# Patient Record
Sex: Female | Born: 2005 | Race: Black or African American | Hispanic: No | Marital: Single | Smoking: Never smoker
Health system: Southern US, Community
[De-identification: ages and names within clinical notes are randomized; demographics above are authoritative.]

## PROBLEM LIST (undated history)

## (undated) ENCOUNTER — Ambulatory Visit (HOSPITAL_COMMUNITY): Discharge status: Absent without leave | Payer: Medicaid Other

## (undated) DIAGNOSIS — M2180 Other specified acquired deformities of unspecified limb: Secondary | ICD-10-CM

## (undated) DIAGNOSIS — F909 Attention-deficit hyperactivity disorder, unspecified type: Secondary | ICD-10-CM

## (undated) DIAGNOSIS — A6000 Herpesviral infection of urogenital system, unspecified: Secondary | ICD-10-CM

## (undated) DIAGNOSIS — L0100 Impetigo, unspecified: Secondary | ICD-10-CM

## (undated) HISTORY — DX: Impetigo, unspecified: L01.00

## (undated) HISTORY — DX: Other specified acquired deformities of unspecified limb: M21.80

---
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
## 2023-04-14 ENCOUNTER — Encounter (HOSPITAL_COMMUNITY): Payer: Self-pay

## 2023-04-14 ENCOUNTER — Ambulatory Visit (HOSPITAL_COMMUNITY)
Admission: EM | Admit: 2023-04-14 | Discharge: 2023-04-14 | Disposition: A | Discharge status: Home / Self Care | Payer: MEDICAID | Attending: Emergency Medicine | Admitting: Emergency Medicine

## 2023-04-14 DIAGNOSIS — N3000 Acute cystitis without hematuria: Secondary | ICD-10-CM | POA: Insufficient documentation

## 2023-04-14 DIAGNOSIS — N760 Acute vaginitis: Secondary | ICD-10-CM | POA: Insufficient documentation

## 2023-04-14 LAB — POCT URINALYSIS DIP (MANUAL ENTRY)
Bilirubin, UA: NEGATIVE
Glucose, UA: NEGATIVE mg/dL
Nitrite, UA: POSITIVE — AB
Protein Ur, POC: 100 mg/dL — AB
Spec Grav, UA: 1.02 (ref 1.010–1.025)
Urobilinogen, UA: 1 U/dL
pH, UA: 7.5 (ref 5.0–8.0)

## 2023-04-14 MED ORDER — NITROFURANTOIN MONOHYD MACRO 100 MG PO CAPS: 100.0000 mg | ORAL_CAPSULE | Freq: Two times a day (BID) | ORAL | 0 refills | Status: DC

## 2023-04-14 NOTE — Discharge Instructions (Addendum)
Your urine test came back positive for a urinary tract infection.  I am sending in antibiotics, take these 2 times daily with food until finished.  Take them even if you start feeling better.   We have tested you for vaginal infections today and we will contact you if anything results is abnormal.  Return to clinic for any new or urgent symptoms.

## 2023-04-14 NOTE — ED Triage Notes (Signed)
Pt c/o irritation on urination with urgency and frequency x2 days. Denies OTC meds.

## 2023-04-14 NOTE — ED Provider Notes (Signed)
[facility]    CSN: [id number] Arrival date & time: 04/14/23  1403      History   Chief Complaint Chief Complaint  Patient presents with   Urinary Tract Infection    HPI Samantha Hill is a 18 y.o. female.   Patient presents to clinic complaining of urinary frequency and urgency for the past few days.  She recently got off her menstrual cycle and has an IUD.  Reports during her menstrual cycles she was sexually active with a female who has other sexual partners.  Having any dysuria.  Will have some vaginal irritation and itching after urinating.  No changes to vaginal discharge.  No odor to discharge.  Some low back pain.  No abdominal pain.  No nausea or vomiting.  The history is provided by the patient and medical records.  Urinary Tract Infection   Past Medical History:  Diagnosis Date   ADHD (attention deficit hyperactivity disorder)    BULLOUS IMPETIGO 08/20/2009   Qualifier: Diagnosis of  By: Constance Goltz MD, Matthew     Chlamydia 02/11/2022   Genital herpes    Gonorrhea 02/11/2022   TIBIAL TORSION 04/25/2010   Annotation: Mild, not affecting gait. Qualifier: Diagnosis of  By: Benjamin Stain MD, Thomas     Trichomonal vaginitis 02/11/2022    Patient Active Problem List   Diagnosis Date Noted   Cesarean delivery delivered 02/14/2022   IUD (intrauterine device) in place 02/14/2022   HSV (herpes simplex virus) infection- New diagosis at 34 wks 02/02/2022   Depression 07/30/2021   Auditory hallucination 07/08/2019   ADHD (attention deficit hyperactivity disorder) 11/04/2010    Past Surgical History:  Procedure Laterality Date   CESAREAN SECTION N/A 02/14/2022   Procedure: CESAREAN SECTION;  Surgeon: Catalina Antigua, MD;  Location: [facility];  Service: Obstetrics;  Laterality: N/A;    OB History     Gravida  1   Para  1   Term  1   Preterm      AB      Living  1      SAB      IAB      Ectopic      Multiple  0   Live Births  1             Home Medications    Prior to Admission medications   Medication Sig Start Date End Date Taking? Authorizing Provider  nitrofurantoin, macrocrystal-monohydrate, (MACROBID) 100 MG capsule Take 1 capsule (100 mg total) by mouth 2 (two) times daily. 04/14/23  Yes Rinaldo Ratel, [place] N, FNP  acetaminophen (TYLENOL) 325 MG tablet Take 2 tablets (650 mg total) by mouth every 6 (six) hours as needed. Patient not taking: Reported on 04/22/2022 02/17/22   Myna Hidalgo, DO  bismuth subsalicylate (PEPTO BISMOL) 262 MG chewable tablet Chew 524 mg by mouth as needed for indigestion. Patient not taking: Reported on 04/22/2022    [provider]  cetirizine (ZYRTEC) 10 MG tablet Take 1 tablet (10 mg total) by mouth daily. Patient not taking: Reported on 04/22/2022 12/07/21   Carlisle Beers, FNP  ferrous sulfate 325 (65 FE) MG EC tablet Take 1 tablet (325 mg total) by mouth every other day. Patient not taking: Reported on 04/22/2022 02/11/22 08/10/22  Anyanwu, Jethro Bastos, MD  fluticasone (FLONASE) 50 MCG/ACT nasal spray Place 1 spray into both nostrils daily. Patient not taking: Reported on 04/22/2022 12/07/21   Carlisle Beers, FNP  ibuprofen (ADVIL) 600 MG tablet  Take 1 tablet (600 mg total) by mouth every 6 (six) hours as needed. Patient not taking: Reported on 04/22/2022 02/17/22   Myna Hidalgo, DO  Prenatal Vit-Min-FA-Fish Oil ([employer] PRENATAL GUMMY) 0.4-113.5 MG CHEW Chew 1 each by mouth daily. Patient not taking: Reported on 04/22/2022 02/10/22   Billey Co, MD  valACYclovir (VALTREX) 500 MG tablet Take two tablets by mouth twice daily for ten days; then one tablet twice daily for remainder of pregnancy Patient not taking: Reported on 04/22/2022 01/28/22   Federico Flake, MD    Family History Family History  Problem Relation Age of Onset   Asthma Sister    Asthma Brother    Cancer Maternal Aunt    Diabetes Maternal Grandmother    Birth defects Neg Hx    Heart  disease Neg Hx    Hypertension Neg Hx    Stroke Neg Hx     Social History Social History   Tobacco Use   Smoking status: Never   Smokeless tobacco: Never  Vaping Use   Vaping status: Never Used  Substance Use Topics   Alcohol use: Never   Drug use: Not Currently    Types: Marijuana    Comment: last used 2 weeks ago as of 11/15     Allergies   Doxycycline   Review of Systems Review of Systems  Per HPI   Physical Exam Triage Vital Signs ED Triage Vitals [04/14/23 1517]  Encounter Vitals Group     BP 126/83     Systolic BP Percentile      Diastolic BP Percentile      Pulse Rate 82     Resp 18     Temp 98 F (36.7 C)     Temp Source Oral     SpO2 99 %     Weight      Height      Head Circumference      Peak Flow      Pain Score 0     Pain Loc      Pain Education      Exclude from Growth Chart    No data found.  Updated Vital Signs BP 126/83 (BP Location: Left Arm)   Pulse 82   Temp 98 F (36.7 C) (Oral)   Resp 18   LMP  (Approximate)   SpO2 99%   Breastfeeding No   Visual Acuity Right Eye Distance:   Left Eye Distance:   Bilateral Distance:    Right Eye Near:   Left Eye Near:    Bilateral Near:     Physical Exam Vitals and nursing note reviewed.  Constitutional:      Appearance: Normal appearance.  HENT:     Head: Normocephalic and atraumatic.     Right Ear: External ear normal.     Left Ear: External ear normal.     Nose: Nose normal.     Mouth/Throat:     Mouth: Mucous membranes are moist.  Eyes:     Conjunctiva/sclera: Conjunctivae normal.  Cardiovascular:     Rate and Rhythm: Normal rate.  Pulmonary:     Effort: Pulmonary effort is normal. No respiratory distress.  Abdominal:     Tenderness: There is no right CVA tenderness or left CVA tenderness.  Musculoskeletal:        General: Normal range of motion.  Skin:    General: Skin is warm and dry.  Neurological:     General: No focal deficit present.  Mental Status:  She is alert and oriented to person, place, and time.  Psychiatric:        Mood and Affect: Mood normal.        Behavior: Behavior normal.      UC Treatments / Results  Labs (all labs ordered are listed, but only abnormal results are displayed) Labs Reviewed  POCT URINALYSIS DIP (MANUAL ENTRY) - Abnormal; Notable for the following components:      Result Value   Ketones, POC UA trace (5) (*)    Blood, UA moderate (*)    Protein Ur, POC =100 (*)    Nitrite, UA Positive (*)    Leukocytes, UA Small (1+) (*)    All other components within normal limits  URINE CULTURE  CERVICOVAGINAL ANCILLARY ONLY    EKG   Radiology No results found.  Procedures Procedures (including critical care time)  Medications Ordered in UC Medications - No data to display  Initial Impression / Assessment and Plan / UC Course  I have reviewed the triage vital signs and the nursing notes.  Pertinent labs & imaging results that were available during my care of the patient were reviewed by me and considered in my medical decision making (see chart for details).  Vitals and triage reviewed, patient is hemodynamically stable.  Negative for CVA tenderness.  Urinalysis showed moderate red blood cells, protein, trace ketones, positive for nitrites and small leukocytes.  Will treat with Macrobid and send urine off for culture.  Concern for STI with vaginal discomfort and new sexual partner, cytology swab obtained.  Staff will contact if additional treatment is needed.  Plan of care, follow-up care return precautions given, no questions at this time.      Final Clinical Impressions(s) / UC Diagnoses   Final diagnoses:  Acute vaginitis  Acute cystitis without hematuria     Discharge Instructions      Your urine test came back positive for a urinary tract infection.  I am sending in antibiotics, take these 2 times daily with food until finished.  Take them even if you start feeling better.   We have  tested you for vaginal infections today and we will contact you if anything results is abnormal.  Return to clinic for any new or urgent symptoms.      ED Prescriptions     Medication Sig Dispense Auth. Provider   nitrofurantoin, macrocrystal-monohydrate, (MACROBID) 100 MG capsule Take 1 capsule (100 mg total) by mouth 2 (two) times daily. 10 capsule [place]ba Ige, [place] N, FNP      PDMP not reviewed this encounter.   Brahm Barbeau, [place] N, [place] 04/14/23 [phone]

## 2023-04-15 LAB — CERVICOVAGINAL ANCILLARY ONLY
Bacterial Vaginitis (gardnerella): POSITIVE — AB
Candida Glabrata: NEGATIVE
Candida Vaginitis: NEGATIVE
Chlamydia: POSITIVE — AB
Comment: NEGATIVE
Comment: NEGATIVE
Comment: NEGATIVE
Comment: NEGATIVE
Comment: NEGATIVE
Comment: NORMAL
Neisseria Gonorrhea: NEGATIVE
Trichomonas: NEGATIVE

## 2023-04-16 ENCOUNTER — Telehealth (HOSPITAL_BASED_OUTPATIENT_CLINIC_OR_DEPARTMENT_OTHER): Payer: Self-pay

## 2023-04-16 ENCOUNTER — Telehealth (HOSPITAL_COMMUNITY): Payer: Self-pay | Admitting: Emergency Medicine

## 2023-04-16 MED ORDER — AZITHROMYCIN 250 MG PO TABS: ORAL_TABLET | ORAL | 0 refills | Status: DC

## 2023-04-16 MED ORDER — AZITHROMYCIN 250 MG PO TABS: 1000.0000 mg | ORAL_TABLET | Freq: Once | ORAL | 0 refills | Status: DC

## 2023-04-16 MED ORDER — METRONIDAZOLE 500 MG PO TABS: 500.0000 mg | ORAL_TABLET | Freq: Two times a day (BID) | ORAL | 0 refills | Status: DC

## 2023-04-16 NOTE — Telephone Encounter (Signed)
Created in error. Provider has already sent medication.

## 2023-04-16 NOTE — Telephone Encounter (Signed)
Patient contacted via telephone and notified of positive chlamydia and bacterial vaginosis results.  Doxycycline is listed as allergy, will do 1 dose of azithromycin and Flagyl.  Patient verbalized understanding, no questions at this time.

## 2023-04-16 NOTE — Telephone Encounter (Signed)
Pharmacy requesting clarification with azithromycin prescription, clarified.

## 2023-05-07 ENCOUNTER — Encounter (HOSPITAL_COMMUNITY): Payer: Self-pay

## 2023-05-07 ENCOUNTER — Ambulatory Visit (HOSPITAL_COMMUNITY)
Admission: EM | Admit: 2023-05-07 | Discharge: 2023-05-07 | Disposition: A | Discharge status: Home / Self Care | Payer: MEDICAID | Attending: Family Medicine | Admitting: Family Medicine

## 2023-05-07 DIAGNOSIS — J020 Streptococcal pharyngitis: Secondary | ICD-10-CM

## 2023-05-07 LAB — POCT RAPID STREP A (OFFICE): Rapid Strep A Screen: POSITIVE — AB

## 2023-05-07 MED ORDER — IBUPROFEN 600 MG PO TABS: 600.0000 mg | ORAL_TABLET | Freq: Three times a day (TID) | ORAL | 0 refills | Status: AC | PRN

## 2023-05-07 MED ORDER — AMOXICILLIN 875 MG PO TABS: 875.0000 mg | ORAL_TABLET | Freq: Two times a day (BID) | ORAL | 0 refills | Status: AC

## 2023-05-07 NOTE — ED Provider Notes (Signed)
[facility]    CSN: [id number] Arrival date & time: 05/07/23  1719      History   Chief Complaint Chief Complaint  Patient presents with   Sore Throat    HPI Samantha Hill is a 18 y.o. female.    Sore Throat  Here for sore throat that began 2 days ago.  She is not having any nasal congestion or cough or nausea or vomiting or diarrhea.  No fever.  She has had some myalgia in her back.  No known exposures.  Last menstrual cycle was a few days ago.  Doxycycline causes nausea and vomiting  Past Medical History:  Diagnosis Date   ADHD (attention deficit hyperactivity disorder)    BULLOUS IMPETIGO 08/20/2009   Qualifier: Diagnosis of  By: Constance Goltz MD, Matthew     Chlamydia 02/11/2022   Genital herpes    Gonorrhea 02/11/2022   TIBIAL TORSION 04/25/2010   Annotation: Mild, not affecting gait. Qualifier: Diagnosis of  By: Benjamin Stain MD, Thomas     Trichomonal vaginitis 02/11/2022    Patient Active Problem List   Diagnosis Date Noted   Cesarean delivery delivered 02/14/2022   IUD (intrauterine device) in place 02/14/2022   HSV (herpes simplex virus) infection- New diagosis at 34 wks 02/02/2022   Depression 07/30/2021   Auditory hallucination 07/08/2019   ADHD (attention deficit hyperactivity disorder) 11/04/2010    Past Surgical History:  Procedure Laterality Date   CESAREAN SECTION N/A 02/14/2022   Procedure: CESAREAN SECTION;  Surgeon: Catalina Antigua, MD;  Location: [facility];  Service: Obstetrics;  Laterality: N/A;    OB History     Gravida  1   Para  1   Term  1   Preterm      AB      Living  1      SAB      IAB      Ectopic      Multiple  0   Live Births  1            Home Medications    Prior to Admission medications   Medication Sig Start Date End Date Taking? Authorizing Provider  amoxicillin (AMOXIL) 875 MG tablet Take 1 tablet (875 mg total) by mouth 2 (two) times daily for 10 days. 05/07/23 05/17/23 Yes  Zenia Resides, MD  ibuprofen (ADVIL) 600 MG tablet Take 1 tablet (600 mg total) by mouth every 8 (eight) hours as needed. 05/07/23   Zenia Resides, MD  Prenatal Vit-Min-FA-Fish Oil ([employer] PRENATAL GUMMY) 0.4-113.5 MG CHEW Chew 1 each by mouth daily. Patient not taking: Reported on 04/22/2022 02/10/22   Billey Co, MD  valACYclovir (VALTREX) 500 MG tablet Take two tablets by mouth twice daily for ten days; then one tablet twice daily for remainder of pregnancy Patient not taking: Reported on 04/22/2022 01/28/22   Federico Flake, MD    Family History Family History  Problem Relation Age of Onset   Asthma Sister    Asthma Brother    Cancer Maternal Aunt    Diabetes Maternal Grandmother    Birth defects Neg Hx    Heart disease Neg Hx    Hypertension Neg Hx    Stroke Neg Hx     Social History Social History   Tobacco Use   Smoking status: Never   Smokeless tobacco: Never  Vaping Use   Vaping status: Never Used  Substance Use Topics   Alcohol use: Never  Drug use: Not Currently    Types: Marijuana    Comment: last used 2 weeks ago as of 11/15     Allergies   Doxycycline   Review of Systems Review of Systems   Physical Exam Triage Vital Signs ED Triage Vitals [05/07/23 1759]  Encounter Vitals Group     BP 98/73     Systolic BP Percentile      Diastolic BP Percentile      Pulse Rate 76     Resp 14     Temp 97.8 F (36.6 C)     Temp Source Oral     SpO2 98 %     Weight      Height      Head Circumference      Peak Flow      Pain Score 4     Pain Loc      Pain Education      Exclude from Growth Chart    No data found.  Updated Vital Signs BP 98/73 (BP Location: Left Arm)   Pulse 76   Temp 97.8 F (36.6 C) (Oral)   Resp 14   LMP  (Approximate)   SpO2 98%   Visual Acuity Right Eye Distance:   Left Eye Distance:   Bilateral Distance:    Right Eye Near:   Left Eye Near:    Bilateral Near:     Physical Exam Vitals reviewed.   Constitutional:      General: She is not in acute distress.    Appearance: She is not toxic-appearing.  HENT:     Right Ear: Tympanic membrane and ear canal normal.     Left Ear: Tympanic membrane and ear canal normal.     Nose: Nose normal.     Mouth/Throat:     Mouth: Mucous membranes are moist.     Comments: Tonsils are 3+ hypertrophied and mildly erythematous with some white exudate covering the tonsils.  Uvula is in the midline and there is no asymmetry. Eyes:     Extraocular Movements: Extraocular movements intact.     Conjunctiva/sclera: Conjunctivae normal.     Pupils: Pupils are equal, round, and reactive to light.  Cardiovascular:     Rate and Rhythm: Normal rate and regular rhythm.     Heart sounds: No murmur heard. Pulmonary:     Effort: Pulmonary effort is normal. No respiratory distress.     Breath sounds: No wheezing, rhonchi or rales.  Chest:     Chest wall: No tenderness.  Musculoskeletal:     Cervical back: Neck supple.  Lymphadenopathy:     Cervical: No cervical adenopathy.  Skin:    Capillary Refill: Capillary refill takes less than 2 seconds.     Coloration: Skin is not jaundiced or pale.  Neurological:     General: No focal deficit present.     Mental Status: She is alert and oriented to person, place, and time.  Psychiatric:        Behavior: Behavior normal.      UC Treatments / Results  Labs (all labs ordered are listed, but only abnormal results are displayed) Labs Reviewed  POCT RAPID STREP A (OFFICE) - Abnormal; Notable for the following components:      Result Value   Rapid Strep A Screen Positive (*)    All other components within normal limits    EKG   Radiology No results found.  Procedures Procedures (including critical care time)  Medications Ordered in  UC Medications - No data to display  Initial Impression / Assessment and Plan / UC Course  I have reviewed the triage vital signs and the nursing notes.  Pertinent labs  & imaging results that were available during my care of the patient were reviewed by me and considered in my medical decision making (see chart for details).     Rapid strep is positive.  Amoxicillin is sent in to treat. Final Clinical Impressions(s) / UC Diagnoses   Final diagnoses:  Strep pharyngitis     Discharge Instructions      Strep test is positive 2 days into taking the antibiotics, throw away the toothbrush and begin using a new one. Patient is not contagious after 24 hours of antibiotics; a full 10 days should be completed to prevent rheumatic fever  Take amoxicillin 875 mg--1 tab twice daily for 10 days  Take ibuprofen 600 mg--1 tab every 8 hours as needed for pain.      ED Prescriptions     Medication Sig Dispense Auth. Provider   ibuprofen (ADVIL) 600 MG tablet Take 1 tablet (600 mg total) by mouth every 8 (eight) hours as needed. 21 tablet Hessie Varone, Janace Aris, MD   amoxicillin (AMOXIL) 875 MG tablet Take 1 tablet (875 mg total) by mouth 2 (two) times daily for 10 days. 20 tablet Betzayda Braxton, Janace Aris, MD      PDMP not reviewed this encounter.   Zenia Resides, MD 05/07/23 [phone]

## 2023-05-07 NOTE — ED Triage Notes (Signed)
Patient c/o sore throat and slight body aches x 2 days.   Patient states she has been taking Tylenol and the last dose was yesterday.

## 2023-05-07 NOTE — Discharge Instructions (Signed)
Strep test is positive 2 days into taking the antibiotics, throw away the toothbrush and begin using a new one. Patient is not contagious after 24 hours of antibiotics; a full 10 days should be completed to prevent rheumatic fever  Take amoxicillin 875 mg--1 tab twice daily for 10 days  Take ibuprofen 600 mg--1 tab every 8 hours as needed for pain.

## 2023-06-24 ENCOUNTER — Ambulatory Visit: Payer: MEDICAID | Admitting: Family Medicine

## 2023-06-24 ENCOUNTER — Encounter: Payer: Self-pay | Admitting: Family Medicine

## 2023-06-24 VITALS — BP 120/85 | HR 72 | Ht 66.0 in | Wt 192.8 lb

## 2023-06-24 DIAGNOSIS — J351 Hypertrophy of tonsils: Secondary | ICD-10-CM | POA: Diagnosis not present

## 2023-06-24 DIAGNOSIS — Z1331 Encounter for screening for depression: Secondary | ICD-10-CM | POA: Diagnosis not present

## 2023-06-24 DIAGNOSIS — J302 Other seasonal allergic rhinitis: Secondary | ICD-10-CM

## 2023-06-24 MED ORDER — CETIRIZINE HCL 10 MG PO TABS: 10.0000 mg | ORAL_TABLET | Freq: Every day | ORAL | 11 refills | Status: AC

## 2023-06-24 MED ORDER — FLUTICASONE PROPIONATE 50 MCG/ACT NA SUSP: 2.0000 | Freq: Every day | NASAL | 6 refills | Status: AC

## 2023-06-24 NOTE — Patient Instructions (Signed)
 It was great to see you today! Thank you for choosing [facility] for your primary care.  Today we addressed: 1. Seasonal allergies Please start twice daily fluticasone nasal spray, 2 sprays each nostril.  Will take a few weeks to start helping. Please start cetirizine daily, up to twice a day if needed.  2. Large tonsils Referred you to ENT specialist, they will call you to set up an appointment. Please give [place] a call if you don't hear back in the next 3 weeks.  You should call our clinic or return if not improving after a week.  Thank you for coming to see [place] at [facility] and for the opportunity to care for you! Sharion Dove Betha Shadix, MD 06/24/2023, 4:38 PM

## 2023-06-24 NOTE — Progress Notes (Signed)
   SUBJECTIVE:   CHIEF COMPLAINT / HPI:  Samantha Hill is a 18 y.o. female with a pertinent past medical history of depression and seasonal allergies presenting to the clinic for allergy flare.  Seasonal allergies Has used cetirizine 10 mg in the past. Complains of runny nose, itchy eyes since the season change and pollen.. Symptoms significantly worse outdoors. Some dyspnea if pollen gets in nose. Reports severe itching and tearing of eyes if she touches face with any pollen on hands.   PERTINENT PMH / PSH: Seasonal allergies, HSV in pregnancy, ADHD   OBJECTIVE:   BP 120/85   Pulse 72   Ht 5\' 6"  (1.676 m)   Wt 192 lb 12.8 oz (87.5 kg)   SpO2 98%   BMI 31.12 kg/m   General: Age-appropriate, resting comfortably in chair, NAD, alert and at baseline. HEENT:  Head: Normocephalic, atraumatic.  Mild tenderness to percussion over sinuses. Eyes: PERRLA.  Mild conjunctival erythema, no scleral injections. Nose: Significantly boggy and erythematous turbinates. No rhinorrhea. Mouth/Oral: Posterior cobblestoning. MMM.  Brodsky grade 4 tonsils. Cardiovascular: Regular rate and rhythm. Normal S1/S2. No murmurs, rubs, or gallops appreciated. 2+ radial pulses. Pulmonary: Clear bilaterally to ascultation. No wheezes, crackles, or rhonchi. Normal WOB on room air. No accessory muscle use. Extremities: Capillary refill <2 seconds.   ASSESSMENT/PLAN:   Assessment & Plan Seasonal allergies History and exam consistent with seasonal allergies. -Cetirizine 10 mg daily, up to BID if needed -Fluticasone 2 sprays both nares BID -Call back if not improving in 1 to 2 weeks Large tonsils Patient unsure if she wants to go through surgery, but acknowledges that she snores a lot and does not feel rested in the morning.  High risk for OSA.  Open to speaking with ENT. -Ambulatory referral to ENT Positive screening for depression on 9-item Patient Health Questionnaire (PHQ-9) Document history of  depression, not undergoing treatment.  Did note score of 15 on PHQ-9, increased from last visit.  Discussed with patient, she states that her young son as a teen mother is causing her some stress.  States she is coping well overall, declines therapy and other resources at this time. -Notified patient that clinic is available for support and to talk if needed  Damarious Holtsclaw Sharion Dove, MD [facility]

## 2023-09-20 ENCOUNTER — Institutional Professional Consult (permissible substitution) (INDEPENDENT_AMBULATORY_CARE_PROVIDER_SITE_OTHER): Payer: MEDICAID | Admitting: Otolaryngology
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
## 2023-12-01 ENCOUNTER — Encounter: Payer: Self-pay | Admitting: Certified Nurse Midwife

## 2023-12-01 ENCOUNTER — Ambulatory Visit: Payer: MEDICAID | Admitting: Certified Nurse Midwife

## 2023-12-01 VITALS — BP 110/77 | HR 87 | Wt 189.7 lb

## 2023-12-01 DIAGNOSIS — B3731 Acute candidiasis of vulva and vagina: Secondary | ICD-10-CM | POA: Diagnosis not present

## 2023-12-01 DIAGNOSIS — Z975 Presence of (intrauterine) contraceptive device: Secondary | ICD-10-CM

## 2023-12-01 DIAGNOSIS — N3001 Acute cystitis with hematuria: Secondary | ICD-10-CM

## 2023-12-01 LAB — POCT URINALYSIS DIP (DEVICE)
Bilirubin Urine: NEGATIVE
Glucose, UA: NEGATIVE mg/dL
Ketones, ur: NEGATIVE mg/dL
Nitrite: POSITIVE — AB
Protein, ur: >=300 mg/dL — AB
Specific Gravity, Urine: >=1.03 (ref 1.005–1.030)
Urobilinogen, UA: 0.2 mg/dL (ref 0.0–1.0)
pH: 7 (ref 5.0–8.0)

## 2023-12-01 MED ORDER — CEFADROXIL 500 MG PO CAPS: 500.0000 mg | ORAL_CAPSULE | Freq: Two times a day (BID) | ORAL | 0 refills | Status: AC

## 2023-12-01 MED ORDER — FLUCONAZOLE 150 MG PO TABS: 150.0000 mg | ORAL_TABLET | Freq: Every day | ORAL | 1 refills | Status: AC

## 2023-12-01 NOTE — Progress Notes (Signed)
 History:  Ms. Samantha Hill is a 18 y.o. G1P1001 who presents to clinic today for evaluation of IUD, is on her period and having cramping/pain and then noted her IUD strings were hanging out of her. Also reports increased frequency and discomfort/cramping after urination.  Had her IUD placed during her Cesarean in late November.  The following portions of the patient's history were reviewed and updated as appropriate: allergies, current medications, family history, past medical history, social history, past surgical history and problem list.  Review of Systems:  Pertinent items noted in HPI and remainder of comprehensive ROS otherwise negative.   Objective:  Physical Exam BP 110/77   Pulse 87   Wt 189 lb 11.2 oz (86 kg)   Breastfeeding No   BMI 30.62 kg/m  Physical Exam Vitals and nursing note reviewed.  Constitutional:      Appearance: Normal appearance.  Eyes:     Pupils: Pupils are equal, round, and reactive to light.  Cardiovascular:     Rate and Rhythm: Normal rate and regular rhythm.  Pulmonary:     Effort: Pulmonary effort is normal.  Abdominal:     Palpations: Abdomen is soft.     Tenderness: There is no abdominal tenderness. There is no guarding.  Genitourinary:    General: Normal vulva.     Vagina: No vaginal discharge.     Comments: String long but IUD appeared to be in place. Strings trimmed to 3cm. Musculoskeletal:        General: Normal range of motion.     Cervical back: Normal range of motion.  Skin:    General: Skin is warm.     Capillary Refill: Capillary refill takes less than 2 seconds.  Neurological:     Mental Status: She is alert and oriented to person, place, and time.  Psychiatric:        Mood and Affect: Mood normal.        Behavior: Behavior normal.   Labs and Imaging UA: blood, nitrites, protein, bacteria and WBC   Assessment & Plan:  1. IUD (intrauterine device) in place (Primary) - Strings trimmed  2. Acute cystitis with  hematuria - cefadroxil  (DURICEF) 500 MG capsule; Take 1 capsule (500 mg total) by mouth 2 (two) times daily.  Dispense: 14 capsule; Refill: 0  3. Yeast vaginitis - fluconazole  (DIFLUCAN ) 150 MG tablet; Take 1 tablet (150 mg total) by mouth daily.  Dispense: 1 tablet; Refill: 1   Follow up PRN or for annual exam.  Vannie Cornell SAUNDERS, CNM 12/01/2023 1:20 PM

## 2023-12-28 IMAGING — US US OB COMP LESS 14 WK
1 series · 15 of 18 positions shown · non-contrast
Comparison: None Available.

CLINICAL DATA: Last menstrual period 05/29/2021. Gestational age by
last menstrual.: 8 weeks 3 days. Pain.

EXAM:
OBSTETRIC <14 WK ULTRASOUND
TECHNIQUE: Transabdominal ultrasound was performed for evaluation of the
gestation as well as the maternal uterus and adnexal regions.

[Series 1: us ob comp less 14 wk · 15 of 18 slices shown]
[im 1/18]
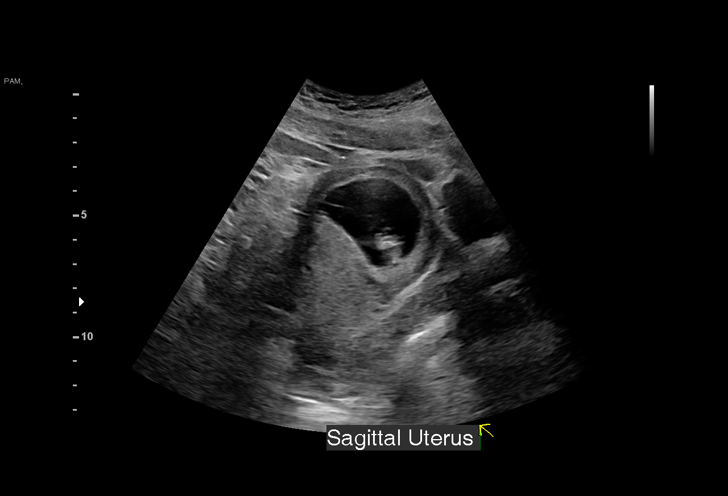
[im 2/18]
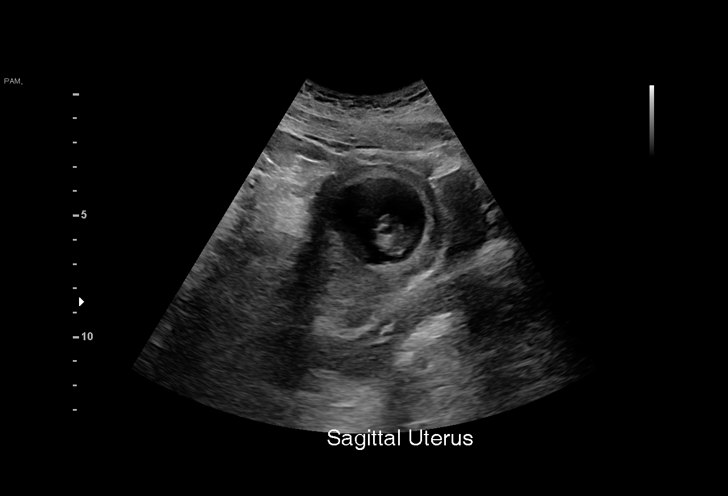
[im 4/18]
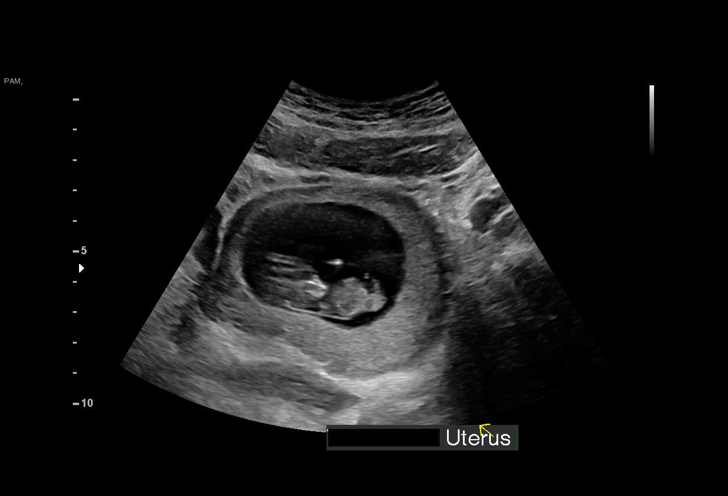
[im 5/18]
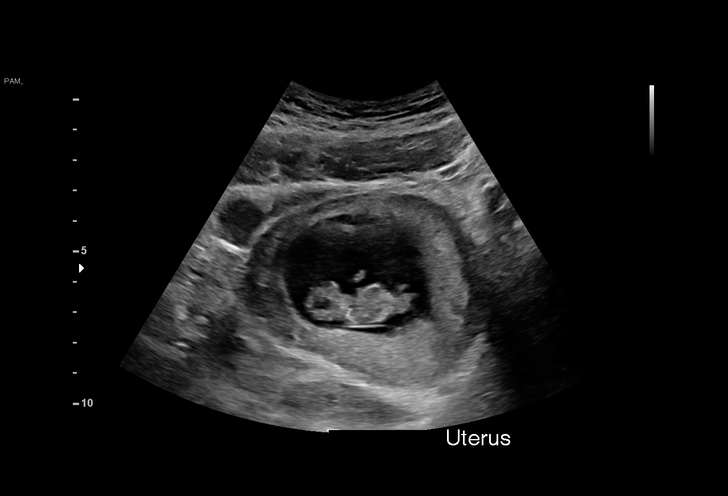
[im 6/18]
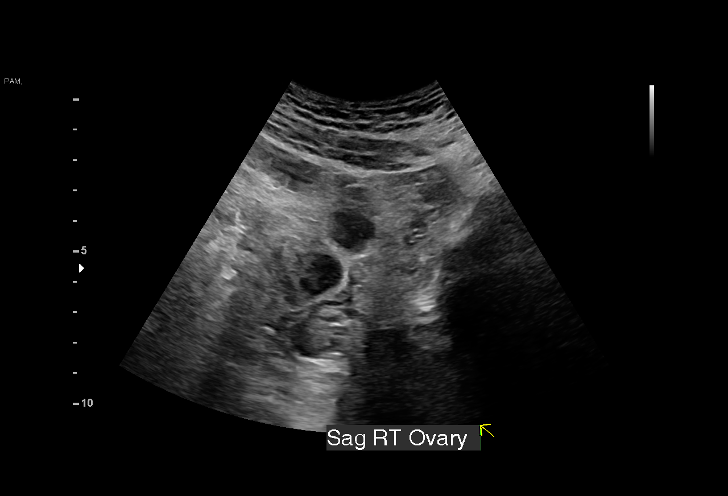
[im 7/18]
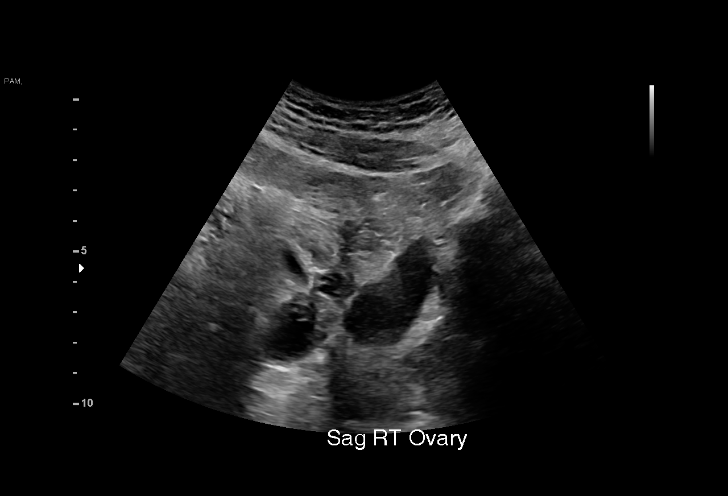
[im 8/18]
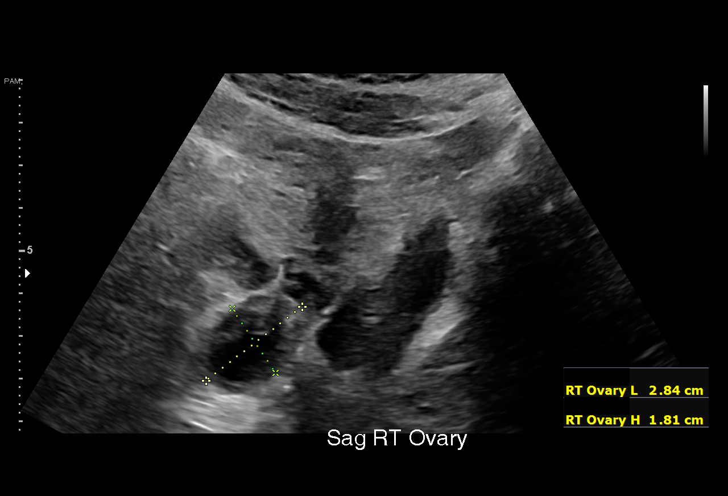
[im 10/18]
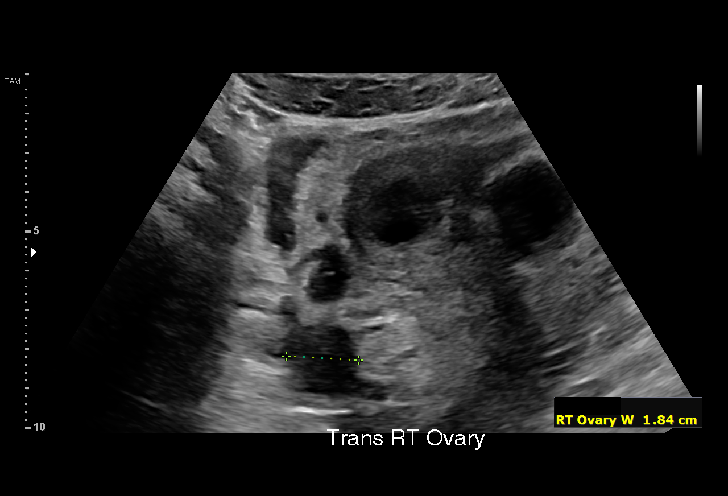
[im 11/18]
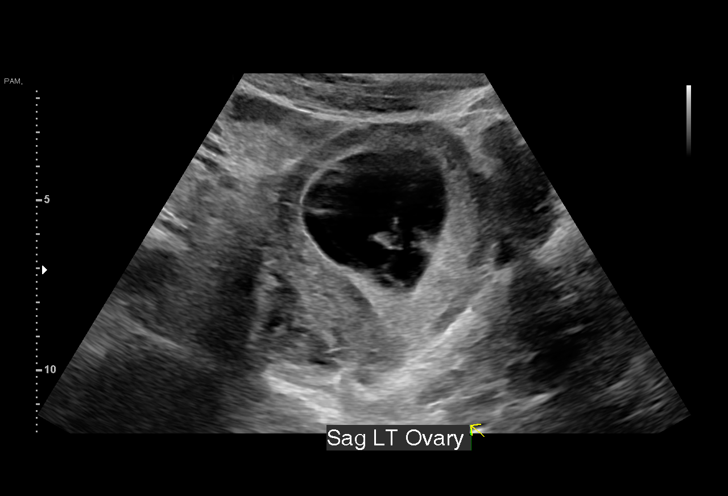
[im 12/18]
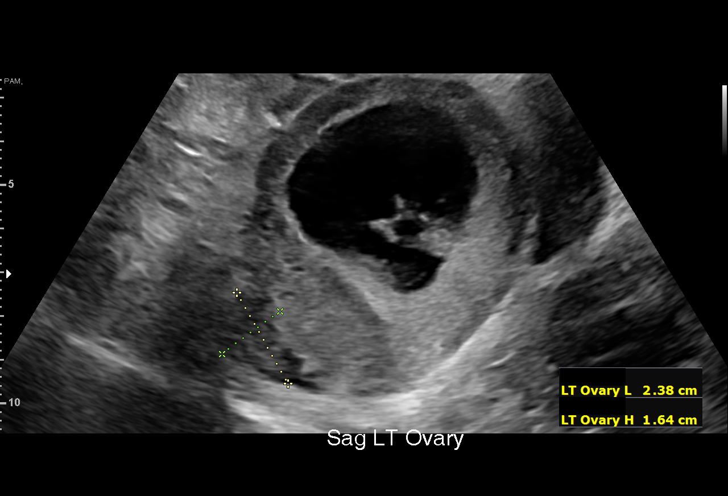
[im 13/18]
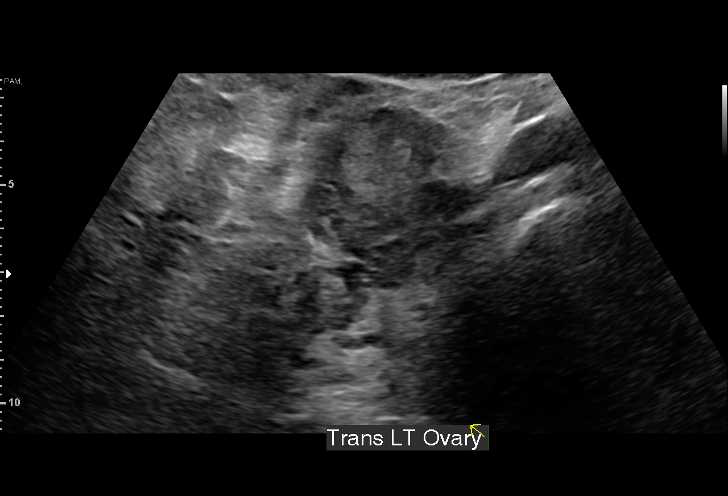
[im 14/18]
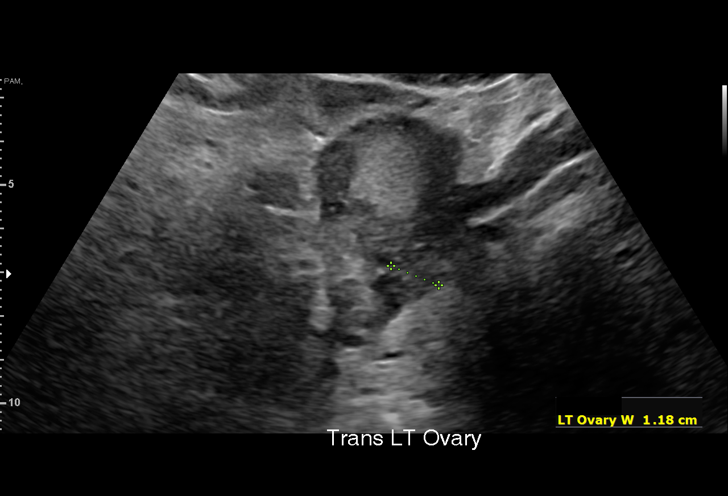
[im 16/18]
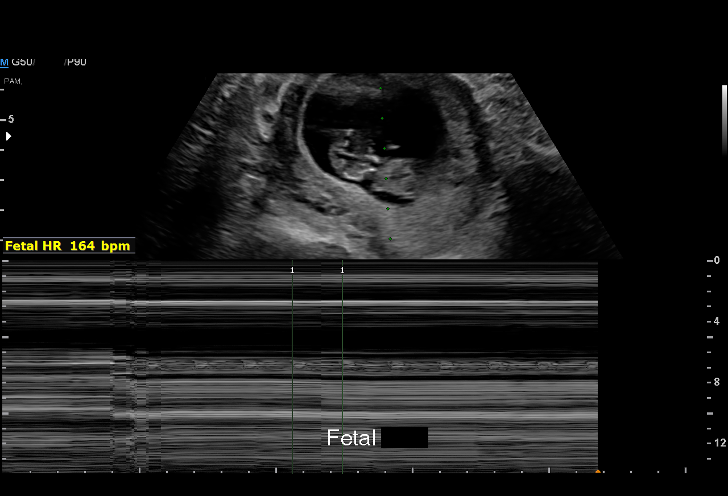
[im 17/18]
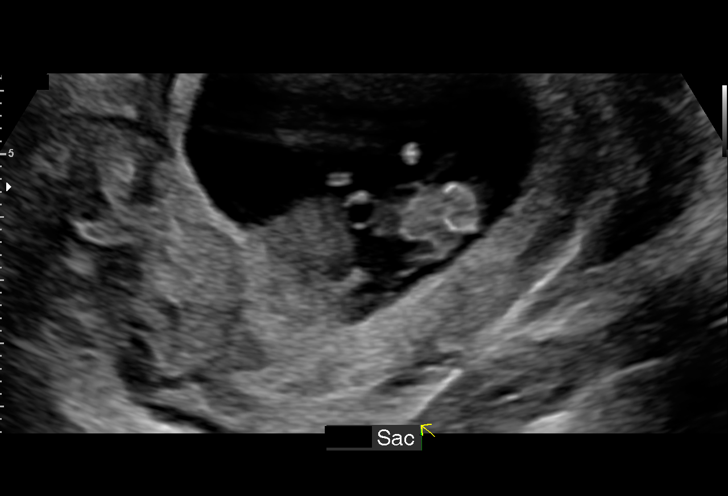
[im 18/18]
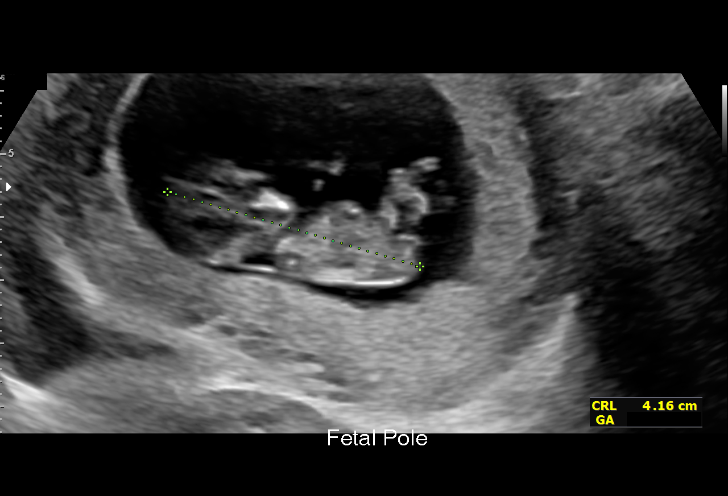

[15 of 18 positions shown; findings below may reference images not displayed]

FINDINGS: Intrauterine gestational sac: None

Yolk sac:  Visualized.

Embryo:  Visualized.

Cardiac Activity: Visualized.

Heart Rate: 164 bpm

CRL:   41 mm   11 w 0 d                  US EDC: 03/07/2022

Subchorionic hemorrhage:  None visualized.

Maternal uterus/adnexae: Within normal limits.
IMPRESSION: Single live intrauterine pregnancy with crown-rump length of 41 mm
corresponding to 11 weeks 0 days estimated gestational age. No
complication is seen at this time. Recommend continued ultrasound
follow-up.

## 2024-03-09 ENCOUNTER — Other Ambulatory Visit: Payer: Self-pay | Admitting: Certified Nurse Midwife

## 2024-03-09 DIAGNOSIS — N3001 Acute cystitis with hematuria: Secondary | ICD-10-CM
